# Patient Record
Sex: Female | Born: 1960 | Race: White | Hispanic: No | Marital: Married | Smoking: Current some day smoker
Health system: Southern US, Community
[De-identification: ages and names within clinical notes are randomized; demographics above are authoritative.]

## PROBLEM LIST (undated history)

## (undated) DIAGNOSIS — M1990 Unspecified osteoarthritis, unspecified site: Secondary | ICD-10-CM

## (undated) DIAGNOSIS — K219 Gastro-esophageal reflux disease without esophagitis: Secondary | ICD-10-CM

## (undated) DIAGNOSIS — G510 Bell's palsy: Secondary | ICD-10-CM

## (undated) HISTORY — PX: FRACTURE SURGERY: SHX138

## (undated) HISTORY — DX: Bell's palsy: G51.0

---

## 2014-06-05 ENCOUNTER — Ambulatory Visit: Payer: Self-pay | Admitting: Physician Assistant

## 2014-06-16 ENCOUNTER — Ambulatory Visit
Admit: 2014-06-16 | Disposition: A | Discharge status: Home / Self Care | Payer: Self-pay | Attending: Family Medicine | Admitting: Family Medicine

## 2015-07-16 IMAGING — CR DG FOOT COMPLETE 3+V*L*
4 series · 4 of 4 positions shown · non-contrast
Comparison: None.

CLINICAL DATA: 54-year-old female who fell yesterday after shoe
broke. Pain at the left fifth metatarsal with swelling and bruising.
Initial encounter.

EXAM:
LEFT FOOT - COMPLETE 3+ VIEW

[foot ap]
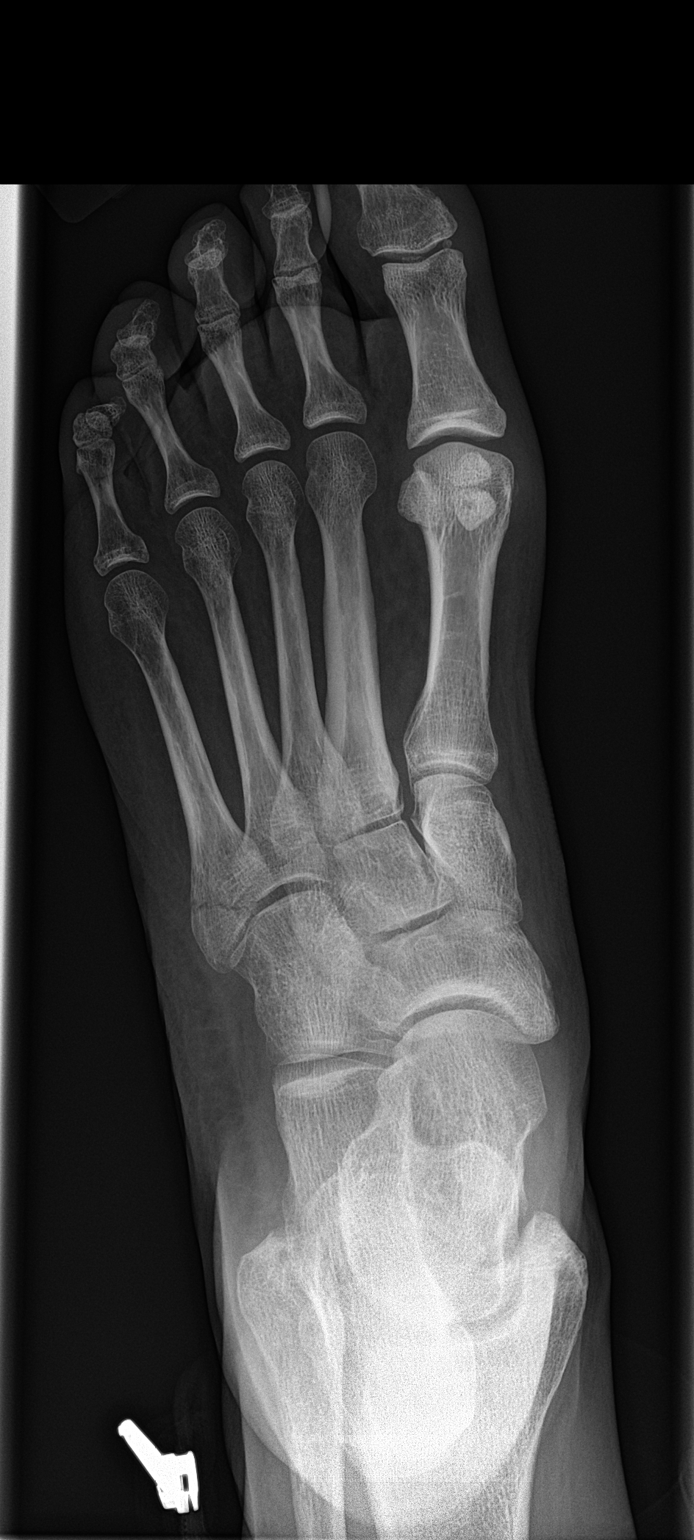

[foot obl]
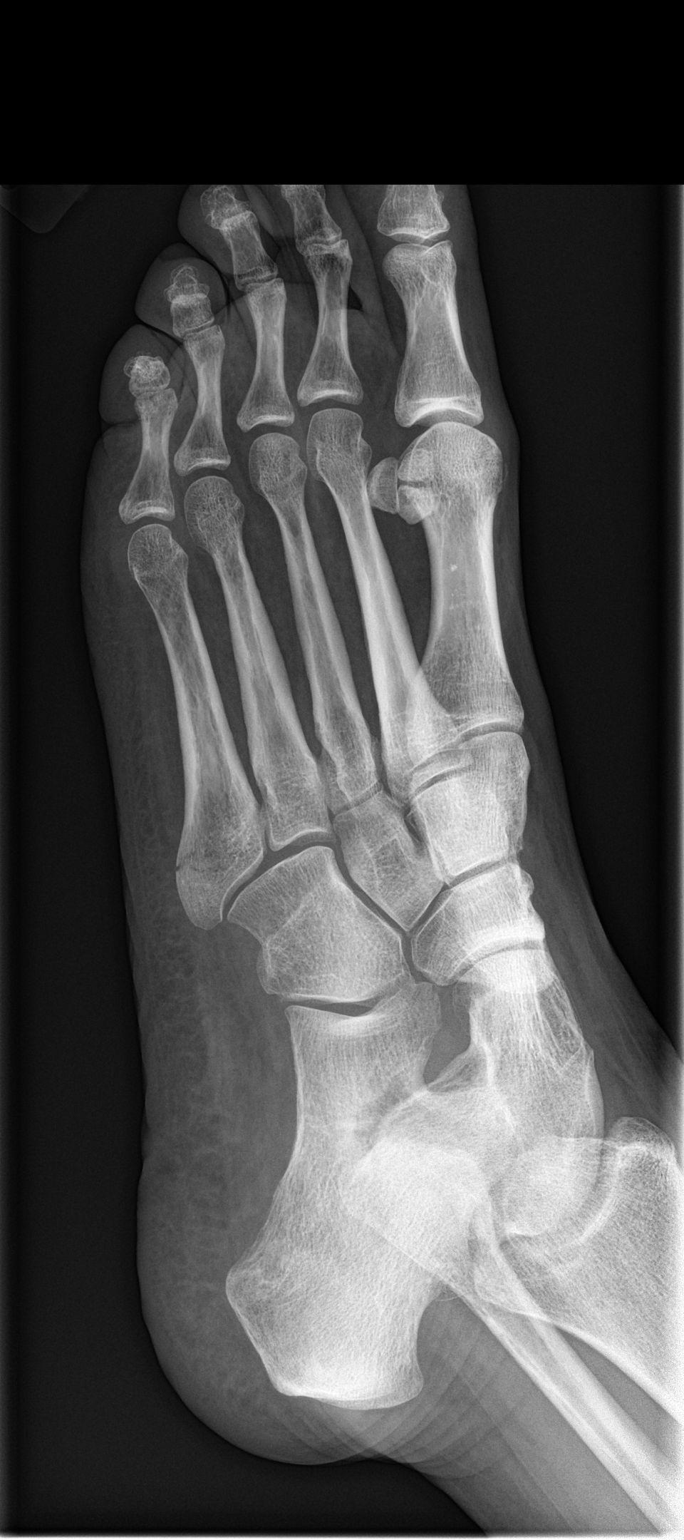

[foot lat (1 of 2)]
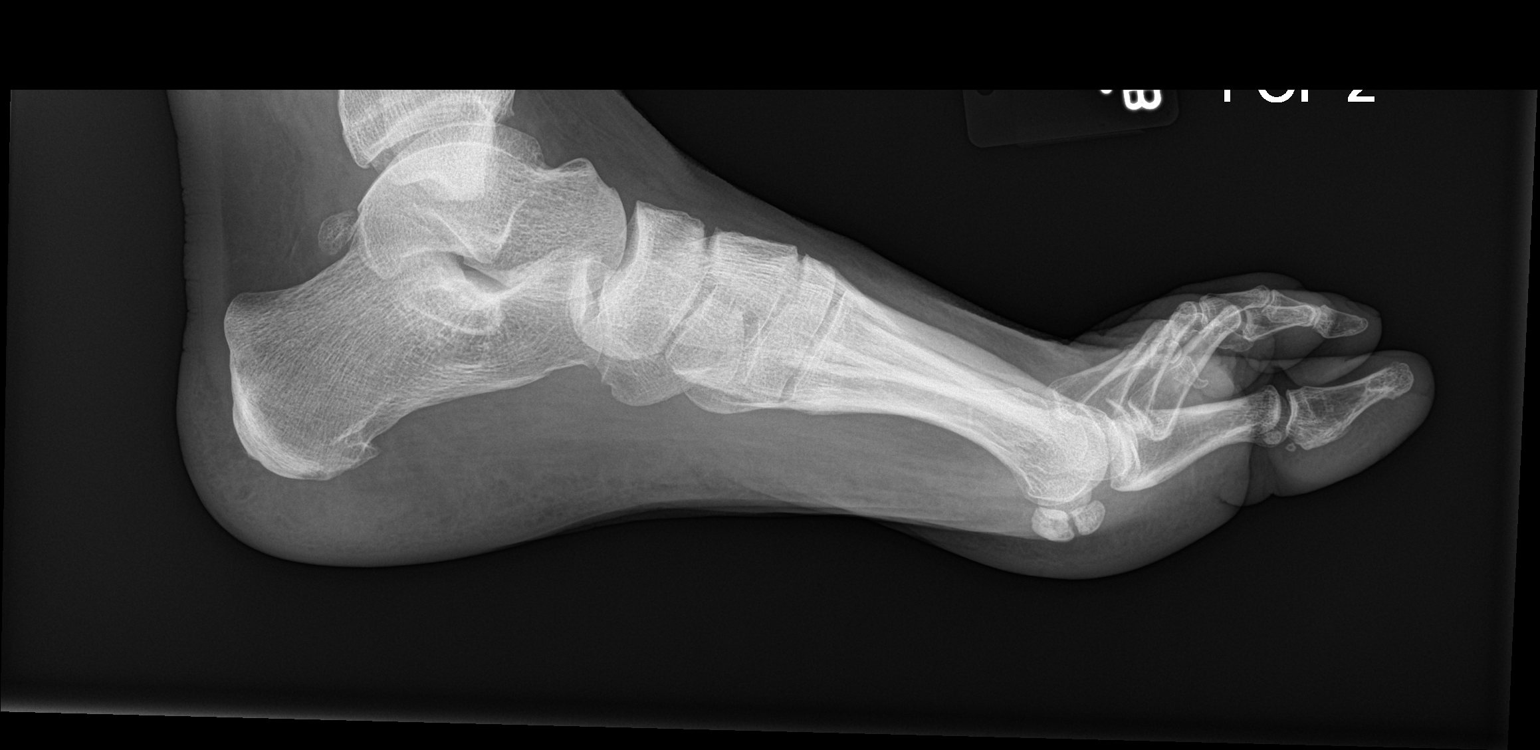

[foot lat (2 of 2)]
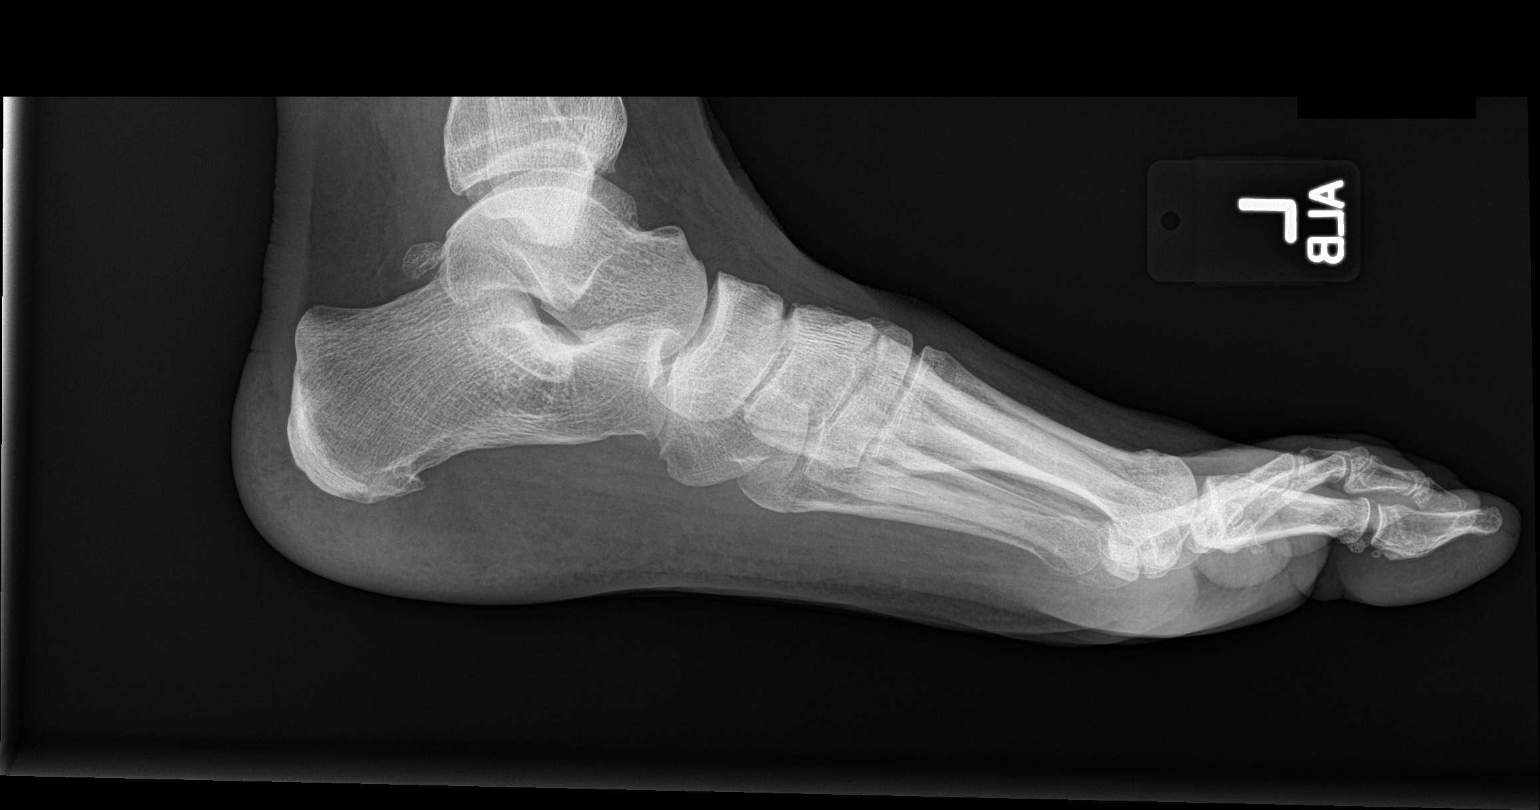

[4 of 4 positions shown; findings below may reference images not displayed]

FINDINGS: Mildly comminuted nondisplaced mostly transverse fracture across the
base of the left fifth metatarsal.

No other acute fracture or dislocation identified. Joint spaces and
alignment are preserved. Calcaneus intact. Bone mineralization is
within normal limits.
IMPRESSION: Mildly comminuted but nondisplaced fracture across the base of the
left fifth metatarsal.

## 2017-06-17 ENCOUNTER — Encounter: Payer: Self-pay | Admitting: Emergency Medicine

## 2017-06-17 ENCOUNTER — Other Ambulatory Visit: Payer: Self-pay

## 2017-06-17 ENCOUNTER — Ambulatory Visit
Admission: EM | Admit: 2017-06-17 | Discharge: 2017-06-17 | Disposition: A | Discharge status: Home / Self Care | Payer: BC Managed Care – PPO | Attending: Family Medicine | Admitting: Family Medicine

## 2017-06-17 DIAGNOSIS — G510 Bell's palsy: Secondary | ICD-10-CM

## 2017-06-17 HISTORY — DX: Unspecified osteoarthritis, unspecified site: M19.90

## 2017-06-17 HISTORY — DX: Gastro-esophageal reflux disease without esophagitis: K21.9

## 2017-06-17 HISTORY — DX: Bell's palsy: G51.0

## 2017-06-17 MED ORDER — PREDNISONE 20 MG PO TABS: ORAL_TABLET | ORAL | 0 refills | Status: DC

## 2017-06-17 MED ORDER — VALACYCLOVIR HCL 1 G PO TABS: 1000.0000 mg | ORAL_TABLET | Freq: Three times a day (TID) | ORAL | 0 refills | Status: DC

## 2017-06-17 NOTE — ED Provider Notes (Signed)
[facility]    CSN: [id number] Arrival date & time: 06/17/17  5784     History   Chief Complaint Chief Complaint  Patient presents with  . Numbness    facial right sided    HPI Elizabeth Huff is a 57 y.o. female.   57 yo female, generally healthy, presents with a c/o right sided facial numbness and weakness of the muscles since last night. Patient states she noticed it again this morning when she went to drink some coffee. Denies numbness or weakness anywhere else on her body. Denies speech or swallowing problems.   The history is provided by the patient.    Past Medical History:  Diagnosis Date  . Arthritis   . GERD (gastroesophageal reflux disease)     There are no active problems to display for this patient.   Past Surgical History:  Procedure Laterality Date  . CESAREAN SECTION      OB History   None      Home Medications    Prior to Admission medications   Medication Sig Start Date End Date Taking? Authorizing Provider  diphenhydrAMINE (BENADRYL) 25 mg capsule Take 25 mg by mouth every 6 (six) hours as needed for allergies.   Yes [provider]  ibuprofen (ADVIL,MOTRIN) 400 MG tablet Take 400 mg by mouth every 6 (six) hours as needed.   Yes [provider]  omeprazole (PRILOSEC) 20 MG capsule Take 20 mg by mouth daily as needed.   Yes [provider]  vitamin E 600 UNIT capsule Take 600 Units by mouth daily.   Yes [provider]  predniSONE (DELTASONE) 20 MG tablet 3 tabs po qd x 7 days, then 2 tabs po qd x 2 days, then 1 tab po qd x 2 days, then half a tab po qd x 2 days 06/17/17   Payton Mccallum, MD  valACYclovir (VALTREX) 1000 MG tablet Take 1 tablet (1,000 mg total) by mouth 3 (three) times daily. 06/17/17   Payton Mccallum, MD    Family History Family History  Problem Relation Age of Onset  . Breast cancer Mother   . Skin cancer Mother   . Anxiety disorder Mother   . GER disease Mother   . Hiatal  hernia Mother     Social History Social History   Tobacco Use  . Smoking status: Current Every Day Smoker  . Smokeless tobacco: Never Used  . Tobacco comment: 2 cigarettes per day  Substance Use Topics  . Alcohol use: Yes    Alcohol/week: 6.0 oz    Types: 10 Glasses of wine per week  . Drug use: Never     Allergies   Patient has no known allergies.   Review of Systems Review of Systems   Physical Exam Triage Vital Signs ED Triage Vitals [06/17/17 0947]  Enc Vitals Group     BP 110/90     Pulse Rate 74     Resp 16     Temp 97.8 F (36.6 C)     Temp Source Oral     SpO2 100 %     Weight 148 lb (67.1 kg)     Height 5\' 6"  (1.676 m)     Head Circumference      Peak Flow      Pain Score 0     Pain Loc      Pain Edu?      Excl. in GC?    No data found.  Updated  Vital Signs BP 110/90 (BP Location: Left Arm)   Pulse 74   Temp 97.8 F (36.6 C) (Oral)   Resp 16   Ht 5\' 6"  (1.676 m)   Wt 148 lb (67.1 kg)   SpO2 100%   BMI 23.89 kg/m   Visual Acuity Right Eye Distance:   Left Eye Distance:   Bilateral Distance:    Right Eye Near:   Left Eye Near:    Bilateral Near:     Physical Exam  Constitutional: She is oriented to person, place, and time. She appears well-developed and well-nourished. No distress.  HENT:  Head: Normocephalic.  Right Ear: Tympanic membrane, external ear and ear canal normal.  Left Ear: Tympanic membrane, external ear and ear canal normal.  Nose: Nose normal.  Mouth/Throat: Oropharynx is clear and moist and mucous membranes are normal.  Eyes: Pupils are equal, round, and reactive to light. Conjunctivae and EOM are normal. Right eye exhibits no discharge. Left eye exhibits no discharge. No scleral icterus.  Neck: Normal range of motion. Neck supple. No JVD present. No tracheal deviation present. No thyromegaly present.  Cardiovascular: Normal rate, regular rhythm, normal heart sounds and intact distal pulses.  No murmur  heard. Pulmonary/Chest: Effort normal and breath sounds normal. No stridor. No respiratory distress. She has no wheezes. She has no rales. She exhibits no tenderness.  Musculoskeletal: She exhibits no edema or tenderness.  Lymphadenopathy:    She has no cervical adenopathy.  Neurological: She is alert and oriented to person, place, and time. She has normal reflexes. GCS eye subscore is 4. GCS verbal subscore is 5. GCS motor subscore is 6.  Asymmetry and decreased facial muscle strength on the right side of the face; otherwise normal muscle strength of upper and lower extremities  Skin: Skin is warm and dry. No rash noted. She is not diaphoretic. No erythema. No pallor.  Psychiatric: She has a normal mood and affect. Her behavior is normal. Judgment and thought content normal.  Vitals reviewed.    UC Treatments / Results  Labs (all labs ordered are listed, but only abnormal results are displayed) Labs Reviewed - No data to display  EKG None Radiology No results found.  Procedures Procedures (including critical care time)  Medications Ordered in UC Medications - No data to display   Initial Impression / Assessment and Plan / UC Course  I have reviewed the triage vital signs and the nursing notes.  Pertinent labs & imaging results that were available during my care of the patient were reviewed by me and considered in my medical decision making (see chart for details).       Final Clinical Impressions(s) / UC Diagnoses   Final diagnoses:  Bell's palsy    ED Discharge Orders        Ordered    predniSONE (DELTASONE) 20 MG tablet     06/17/17 1014    valACYclovir (VALTREX) 1000 MG tablet  3 times daily     06/17/17 1014     1. diagnosis reviewed with patient 2. rx as per orders above; reviewed possible side effects, interactions, risks and benefits  3. Recommend supportive treatment with artificial tear eye drops and eye lubricant to protect the eye  4. Follow-up with  PCP or here in 1 week for recheck or sooner prn if any worsening or other problems  Controlled Substance Prescriptions [place] Controlled Substance Registry consulted? Not Applicable   Payton Mccallumonty, Makita Blow, MD 06/17/17 1130

## 2017-06-17 NOTE — ED Triage Notes (Signed)
Patient in today c/o right sided facial numbness and unable to blink her right eye or smile that started last night. Patient states she has no other numbness in her body. She is able to articulate and had no mobility deficits.

## 2017-07-08 ENCOUNTER — Encounter: Payer: Self-pay | Admitting: Family Medicine

## 2017-07-08 ENCOUNTER — Ambulatory Visit: Payer: BC Managed Care – PPO | Admitting: Family Medicine

## 2017-07-08 VITALS — BP 113/68 | HR 87 | Temp 98.1°F | Resp 15 | Ht 65.0 in | Wt 150.1 lb

## 2017-07-08 DIAGNOSIS — Z7689 Persons encountering health services in other specified circumstances: Secondary | ICD-10-CM | POA: Diagnosis not present

## 2017-07-08 DIAGNOSIS — K219 Gastro-esophageal reflux disease without esophagitis: Secondary | ICD-10-CM

## 2017-07-08 DIAGNOSIS — M1990 Unspecified osteoarthritis, unspecified site: Secondary | ICD-10-CM | POA: Diagnosis not present

## 2017-07-08 DIAGNOSIS — G510 Bell's palsy: Secondary | ICD-10-CM

## 2017-07-08 MED ORDER — VALACYCLOVIR HCL 1 G PO TABS: 1000.0000 mg | ORAL_TABLET | Freq: Three times a day (TID) | ORAL | 0 refills | Status: AC

## 2017-07-08 NOTE — Progress Notes (Signed)
   BP 113/68 (BP Location: Left Arm, Patient Position: Sitting)   Pulse 87   Temp 98.1 F (36.7 C) (Oral)   Resp 15   Ht  (1.651 m)   Wt 150 lb 1.6 oz (68.1 kg)   SpO2 97%   BMI 24.98 kg/m    Subjective:    Patient ID: Elizabeth Huff, female    DOB: [birth year], 56 y.o.   MRN: [id number]  HPI: Elizabeth Huff is a 57 y.o. female  Chief Complaint  Patient presents with  . Establish Care   Pt here today to establish care. Gets CPEs and all long-term medications from the [place] but wanting a PCP for acute issues. Only medical issues are arthritis and GERD, well controlled with ibuprofen and prilosec.    Main concern today is right side bells palsy for which she went to UC 3 weeks ago. Started on prednisone and valtrex and has been doing home exercises to help re-strengthen facial muscles. Having some slow progress, but concerned with not having full functionality back yet. Had some side effects with prednisone but nothing intolerable - weight gain, irritability.   Relevant past medical, surgical, family and social history reviewed and updated as indicated. Interim medical history since our last visit reviewed. Allergies and medications reviewed and updated.  Review of Systems  Per HPI unless specifically indicated above     Objective:    BP 113/68 (BP Location: Left Arm, Patient Position: Sitting)   Pulse 87   Temp 98.1 F (36.7 C) (Oral)   Resp 15   Ht  (1.651 m)   Wt 150 lb 1.6 oz (68.1 kg)   SpO2 97%   BMI 24.98 kg/m   Wt Readings from Last 3 Encounters:  07/08/17 150 lb 1.6 oz (68.1 kg)  06/17/17 148 lb (67.1 kg)    Physical Exam  Constitutional: She is oriented to person, place, and time. She appears well-developed and well-nourished. No distress.  HENT:  Head: Atraumatic.  Eyes: Pupils are equal, round, and reactive to light. Conjunctivae are normal.  Neck: Normal range of motion. Neck supple.  Cardiovascular: Normal rate and regular rhythm.    Pulmonary/Chest: Effort normal and breath sounds normal.  Musculoskeletal: She exhibits no tenderness.  Neurological: She is alert and oriented to person, place, and time.  Diffuse right-sided facial drooping, able to move slightly  Skin: Skin is warm and dry.  Psychiatric: She has a normal mood and affect. Her behavior is normal.  Nursing note and vitals reviewed.   No results found for this or any previous visit.    Assessment & Plan:   Problem List Items Addressed This Visit      Digestive   GERD (gastroesophageal reflux disease) - Primary    Stable, under good control with prilosec. Continue current regimen        Musculoskeletal and Integument   Arthritis    Stable, continue prn ibuprofen for joint pains       Other Visit Diagnoses    Encounter to establish care       Bell's palsy       Will restart valtrex, pt declines further prednisone at this time. Continue home exercises, reassured pt that progress can be slow. Return precautions given       Follow up plan: Return if symptoms worsen or fail to improve.  [facility], will see on prn basis

## 2017-07-11 DIAGNOSIS — M1990 Unspecified osteoarthritis, unspecified site: Secondary | ICD-10-CM | POA: Insufficient documentation

## 2017-07-11 DIAGNOSIS — K219 Gastro-esophageal reflux disease without esophagitis: Secondary | ICD-10-CM | POA: Insufficient documentation

## 2017-07-11 NOTE — Assessment & Plan Note (Signed)
Stable, under good control with prilosec. Continue current regimen

## 2017-07-11 NOTE — Assessment & Plan Note (Signed)
Stable, continue prn ibuprofen for joint pains

## 2017-07-11 NOTE — Patient Instructions (Signed)
Follow up as needed
